# Patient Record
Sex: Female | Born: 1995 | Hispanic: No | Marital: Single | Smoking: Never smoker
Health system: Southern US, Community
[De-identification: ages and names within clinical notes are randomized; demographics above are authoritative.]

## PROBLEM LIST (undated history)

## (undated) DIAGNOSIS — N83201 Unspecified ovarian cyst, right side: Secondary | ICD-10-CM

## (undated) DIAGNOSIS — R519 Headache, unspecified: Secondary | ICD-10-CM

## (undated) DIAGNOSIS — N83202 Unspecified ovarian cyst, left side: Secondary | ICD-10-CM

## (undated) DIAGNOSIS — F509 Eating disorder, unspecified: Secondary | ICD-10-CM

## (undated) DIAGNOSIS — E079 Disorder of thyroid, unspecified: Secondary | ICD-10-CM

## (undated) DIAGNOSIS — F32A Depression, unspecified: Secondary | ICD-10-CM

## (undated) DIAGNOSIS — Z87440 Personal history of urinary (tract) infections: Secondary | ICD-10-CM

## (undated) HISTORY — DX: Disorder of thyroid, unspecified: E07.9

## (undated) HISTORY — DX: Eating disorder, unspecified: F50.9

## (undated) HISTORY — DX: Depression, unspecified: F32.A

## (undated) HISTORY — DX: Personal history of urinary (tract) infections: Z87.440

## (undated) HISTORY — DX: Headache, unspecified: R51.9

---
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
## 2020-08-07 ENCOUNTER — Encounter: Payer: Self-pay | Admitting: Internal Medicine

## 2020-08-07 ENCOUNTER — Other Ambulatory Visit: Payer: Self-pay

## 2020-08-07 ENCOUNTER — Ambulatory Visit: Payer: 59 | Admitting: Internal Medicine

## 2020-08-07 VITALS — BP 128/76 | HR 91 | Temp 98.7°F | Resp 18 | Ht 65.5 in | Wt 278.4 lb

## 2020-08-07 DIAGNOSIS — N926 Irregular menstruation, unspecified: Secondary | ICD-10-CM | POA: Diagnosis not present

## 2020-08-07 DIAGNOSIS — Z0000 Encounter for general adult medical examination without abnormal findings: Secondary | ICD-10-CM

## 2020-08-07 DIAGNOSIS — F4323 Adjustment disorder with mixed anxiety and depressed mood: Secondary | ICD-10-CM | POA: Diagnosis not present

## 2020-08-07 DIAGNOSIS — R7989 Other specified abnormal findings of blood chemistry: Secondary | ICD-10-CM | POA: Diagnosis not present

## 2020-08-07 LAB — LIPID PANEL
Cholesterol: 157 mg/dL (ref 0–200)
HDL: 36.5 mg/dL — ABNORMAL LOW (ref >39.00)
LDL Cholesterol: 81 mg/dL (ref 0–99)
NonHDL: 120.97
Total CHOL/HDL Ratio: 4
Triglycerides: 200 mg/dL — ABNORMAL HIGH (ref 0.0–149.0)
VLDL: 40 mg/dL (ref 0.0–40.0)

## 2020-08-07 LAB — CBC
HCT: 36.8 % (ref 36.0–46.0)
Hemoglobin: 11.8 g/dL — ABNORMAL LOW (ref 12.0–15.0)
MCHC: 32 g/dL (ref 30.0–36.0)
MCV: 80.1 fl (ref 78.0–100.0)
Platelets: 322 10*3/uL (ref 150.0–400.0)
RBC: 4.59 Mil/uL (ref 3.87–5.11)
RDW: 15.7 % — ABNORMAL HIGH (ref 11.5–15.5)
WBC: 10.4 10*3/uL (ref 4.0–10.5)

## 2020-08-07 LAB — T4, FREE: Free T4: 0.64 ng/dL (ref 0.60–1.60)

## 2020-08-07 LAB — COMPREHENSIVE METABOLIC PANEL
ALT: 14 U/L (ref 0–35)
AST: 16 U/L (ref 0–37)
Albumin: 4.3 g/dL (ref 3.5–5.2)
Alkaline Phosphatase: 86 U/L (ref 39–117)
BUN: 11 mg/dL (ref 6–23)
CO2: 26 mEq/L (ref 19–32)
Calcium: 9.4 mg/dL (ref 8.4–10.5)
Chloride: 106 mEq/L (ref 96–112)
Creatinine, Ser: 0.7 mg/dL (ref 0.40–1.20)
GFR: 122.46 mL/min (ref >60.00)
Glucose, Bld: 94 mg/dL (ref 70–99)
Potassium: 3.5 mEq/L (ref 3.5–5.1)
Sodium: 139 mEq/L (ref 135–145)
Total Bilirubin: 0.4 mg/dL (ref 0.2–1.2)
Total Protein: 7.5 g/dL (ref 6.0–8.3)

## 2020-08-07 LAB — TSH: TSH: 2.8 u[IU]/mL (ref 0.35–4.50)

## 2020-08-07 LAB — HEMOGLOBIN A1C: Hgb A1c MFr Bld: 5.8 % (ref 4.6–6.5)

## 2020-08-07 LAB — VITAMIN D 25 HYDROXY (VIT D DEFICIENCY, FRACTURES): VITD: 13.74 ng/mL — ABNORMAL LOW (ref 30.00–100.00)

## 2020-08-07 LAB — VITAMIN B12: Vitamin B-12: 483 pg/mL (ref 211–911)

## 2020-08-07 NOTE — Progress Notes (Signed)
   Subjective:   Patient ID: Alejandra Richardson, female    DOB: [birth year], 25 y.o.   MRN: [id number]  HPI The patient is a new 25 YO female coming in for concerns that she may have endometriosis (strong family history, she does have some abnormal periods and they have not been regular much, denies chance of pregnancy, needs help finding an ob/gyn) and weight (increasing over time and she has tried to lose weight, would like to talk to a counselor, concerned about complications from her weight and wants to get checked) and prior thyroid problems (she is not on medicine for this, has not had follow up for some time, denies cold or heat intolerance, has weight gain).  PMH, [facility], social history reviewed and updated  Review of Systems  Constitutional: Positive for unexpected weight change. Negative for activity change and appetite change.  HENT: Negative.   Eyes: Negative.   Respiratory: Negative for cough, chest tightness and shortness of breath.   Cardiovascular: Negative for chest pain, palpitations and leg swelling.  Gastrointestinal: Negative for abdominal distention, abdominal pain, constipation, diarrhea, nausea and vomiting.  Genitourinary: Positive for menstrual problem.  Musculoskeletal: Negative.   Skin: Negative.   Neurological: Negative.   Psychiatric/Behavioral: Negative.     Objective:  Physical Exam Constitutional:      Appearance: She is well-developed and well-nourished. She is obese.  HENT:     Head: Normocephalic and atraumatic.  Eyes:     Extraocular Movements: EOM normal.  Cardiovascular:     Rate and Rhythm: Normal rate and regular rhythm.  Pulmonary:     Effort: Pulmonary effort is normal. No respiratory distress.     Breath sounds: Normal breath sounds. No wheezing or rales.  Abdominal:     General: Bowel sounds are normal. There is no distension.     Palpations: Abdomen is soft.     Tenderness: There is no abdominal tenderness. There is no rebound.   Musculoskeletal:        General: No edema.     Cervical back: Normal range of motion.  Skin:    General: Skin is warm and dry.  Neurological:     Mental Status: She is alert and oriented to person, place, and time.     Coordination: Coordination normal.  Psychiatric:        Mood and Affect: Mood and affect normal.     Vitals:   08/07/20 1504  BP: 128/76  Pulse: 91  Resp: 18  Temp: 98.7 F (37.1 C)  TempSrc: Oral  SpO2: 98%  Weight: 278 lb 6.4 oz (126.3 kg)  Height: 5' 5.5" (1.664 m)   This visit occurred during the SARS-CoV-2 public health emergency.  Safety protocols were in place, including screening questions prior to the visit, additional usage of staff PPE, and extensive cleaning of exam room while observing appropriate contact time as indicated for disinfecting solutions.   Assessment & Plan:

## 2020-08-07 NOTE — Patient Instructions (Addendum)
You can try counseling or any number of things to help with the depression and anxiety.   We are checking the labs and thyroid today to check everything.  We will get you into an ob/gyn to check for the endometriosis.   Vaccines.gov to find the booster near you.   Stress, Adult Stress is a normal reaction to life events. Stress is what you feel when life demands more than you are used to, or more than you think you can handle. Some stress can be useful, such as studying for a test or meeting a deadline at work. Stress that occurs too often or for too long can cause problems. It can affect your emotional health and interfere with relationships and normal daily activities. Too much stress can weaken your body's defense system (immune system) and increase your risk for physical illness. If you already have a medical problem, stress can make it worse. What are the causes? All sorts of life events can cause stress. An event that causes stress for one person may not be stressful for another person. Major life events, whether positive or negative, commonly cause stress. Examples include:  Losing a job or starting a new job.  Losing a loved one.  Moving to a new town or home.  Getting married or divorced.  Having a baby.  Getting injured or sick. Less obvious life events can also cause stress, especially if they occur day after day or in combination with each other. Examples include:  Working long hours.  Driving in traffic.  Caring for children.  Being in debt.  Being in a difficult relationship. What are the signs or symptoms? Stress can cause emotional symptoms, including:  Anxiety. This is feeling worried, afraid, on edge, overwhelmed, or out of control.  Anger, including irritation or impatience.  Depression. This is feeling sad, down, helpless, or guilty.  Trouble focusing, remembering, or making decisions. Stress can cause physical symptoms, including:  Aches and pains.  These may affect your head, neck, back, stomach, or other areas of your body.  Tight muscles or a clenched jaw.  Low energy.  Trouble sleeping. Stress can cause unhealthy behaviors, including:  Eating to feel better (overeating) or skipping meals.  Working too much or putting off tasks.  Smoking, drinking alcohol, or using drugs to feel better. How is this diagnosed? Stress is diagnosed through an assessment by your health care provider. He or she may diagnose this condition based on:  Your symptoms and any stressful life events.  Your medical history.  Tests to rule out other causes of your symptoms. Depending on your condition, your health care provider may refer you to a specialist for further evaluation. How is this treated? Stress management techniques are the recommended treatment for stress. Medicine is not typically recommended for the treatment of stress. Techniques to reduce your reaction to stressful life events include:  Stress identification. Monitor yourself for symptoms of stress and identify what causes stress for you. These skills may help you to avoid or prepare for stressful events.  Time management. Set your priorities, keep a calendar of events, and learn to say no. Taking these actions can help you avoid making too many commitments. Techniques for coping with stress include:  Rethinking the problem. Try to think realistically about stressful events rather than ignoring them or overreacting. Try to find the positives in a stressful situation rather than focusing on the negatives.  Exercise. Physical exercise can release both physical and emotional tension. The key  is to find a form of exercise that you enjoy and do it regularly.  Relaxation techniques. These relax the body and mind. The key is to find one or more that you enjoy and use the techniques regularly. Examples include: ? Meditation, deep breathing, or progressive relaxation techniques. ? Yoga or  tai chi. ? Biofeedback, mindfulness techniques, or journaling. ? Listening to music, being out in nature, or participating in other hobbies.  Practicing a healthy lifestyle. Eat a balanced diet, drink plenty of water, limit or avoid caffeine, and get plenty of sleep.  Having a strong support network. Spend time with family, friends, or other people you enjoy being around. Express your feelings and talk things over with someone you trust. Counseling or talk therapy with a mental health professional may be helpful if you are having trouble managing stress on your own.   Follow these instructions at home: Lifestyle  Avoid drugs.  Do not use any products that contain nicotine or tobacco, such as cigarettes, e-cigarettes, and chewing tobacco. If you need help quitting, ask your health care provider.  Limit alcohol intake to no more than 1 drink a day for nonpregnant women and 2 drinks a day for men. One drink equals 12 oz of beer, 5 oz of wine, or 1 oz of hard liquor  Do not use alcohol or drugs to relax.  Eat a balanced diet that includes fresh fruits and vegetables, whole grains, lean meats, fish, eggs, and beans, and low-fat dairy. Avoid processed foods and foods high in added fat, sugar, and salt.  Exercise at least 30 minutes on 5 or more days each week.  Get 7-8 hours of sleep each night.   General instructions  Practice stress management techniques as discussed with your health care provider.  Drink enough fluid to keep your urine clear or pale yellow.  Take over-the-counter and prescription medicines only as told by your health care provider.  Keep all follow-up visits as told by your health care provider. This is important.   Contact a health care provider if:  Your symptoms get worse.  You have new symptoms.  You feel overwhelmed by your problems and can no longer manage them on your own. Get help right away if:  You have thoughts of hurting yourself or others. If you  ever feel like you may hurt yourself or others, or have thoughts about taking your own life, get help right away. You can go to your nearest emergency department or call:  Your local emergency services (911 in the [place]  A suicide crisis helpline, such as the [facility] at (765) 370-4746. This is open 24 hours a day. Summary  Stress is a normal reaction to life events. It can cause problems if it happens too often or for too long.  Practicing stress management techniques is the best way to treat stress.  Counseling or talk therapy with a mental health professional may be helpful if you are having trouble managing stress on your own. This information is not intended to replace advice given to you by your health care provider. Make sure you discuss any questions you have with your health care provider. Document Revised: 03/16/2020 Document Reviewed: 03/16/2020 Elsevier Patient Education  2021 [employer].

## 2020-08-10 ENCOUNTER — Telehealth: Payer: Self-pay | Admitting: Internal Medicine

## 2020-08-10 ENCOUNTER — Encounter: Payer: Self-pay | Admitting: Internal Medicine

## 2020-08-10 DIAGNOSIS — N926 Irregular menstruation, unspecified: Secondary | ICD-10-CM | POA: Insufficient documentation

## 2020-08-10 DIAGNOSIS — F4323 Adjustment disorder with mixed anxiety and depressed mood: Secondary | ICD-10-CM | POA: Insufficient documentation

## 2020-08-10 DIAGNOSIS — R7989 Other specified abnormal findings of blood chemistry: Secondary | ICD-10-CM | POA: Insufficient documentation

## 2020-08-10 NOTE — Telephone Encounter (Signed)
I don't see where these labs have been resulted.

## 2020-08-10 NOTE — Assessment & Plan Note (Signed)
Having trouble adjusting to living away from her family and going to school. Referral for counseling done today and we talked about coping strategies.

## 2020-08-10 NOTE — Telephone Encounter (Signed)
    Patient calling to discuss lab results and treatment

## 2020-08-10 NOTE — Assessment & Plan Note (Signed)
Checking TSH and free T4. She is having weight gain which could be related. Adjust as needed.

## 2020-08-10 NOTE — Assessment & Plan Note (Signed)
Referral to ob/gyn and she does have family history of endometriosis. She is willing to get evaluated. Checking total estrogen today.

## 2020-08-10 NOTE — Telephone Encounter (Signed)
Resulted

## 2020-08-10 NOTE — Assessment & Plan Note (Signed)
Checking for complications with lipid panel and HgA1c.

## 2020-08-12 LAB — ESTROGENS, TOTAL: Estrogen: 415.5 pg/mL

## 2020-09-06 ENCOUNTER — Ambulatory Visit (INDEPENDENT_AMBULATORY_CARE_PROVIDER_SITE_OTHER): Payer: 59 | Admitting: Psychology

## 2020-09-06 ENCOUNTER — Ambulatory Visit: Payer: 59 | Admitting: Internal Medicine

## 2020-09-06 ENCOUNTER — Other Ambulatory Visit: Payer: Self-pay

## 2020-09-06 ENCOUNTER — Encounter: Payer: Self-pay | Admitting: Internal Medicine

## 2020-09-06 VITALS — BP 122/76 | HR 64 | Temp 98.8°F | Resp 18 | Ht 65.5 in | Wt 273.0 lb

## 2020-09-06 DIAGNOSIS — F422 Mixed obsessional thoughts and acts: Secondary | ICD-10-CM | POA: Diagnosis not present

## 2020-09-06 DIAGNOSIS — F411 Generalized anxiety disorder: Secondary | ICD-10-CM

## 2020-09-06 DIAGNOSIS — R7989 Other specified abnormal findings of blood chemistry: Secondary | ICD-10-CM

## 2020-09-06 DIAGNOSIS — F331 Major depressive disorder, recurrent, moderate: Secondary | ICD-10-CM | POA: Diagnosis not present

## 2020-09-06 NOTE — Patient Instructions (Signed)
We will check an ultrasound of the thyroid.

## 2020-09-06 NOTE — Assessment & Plan Note (Signed)
Recent labs normal, ordered US thyroid today to assess neck fullness.

## 2020-09-06 NOTE — Progress Notes (Signed)
   Subjective:   Patient ID: Alejandra Richardson, female    DOB: 24-Oct-1995, 25 y.o.   MRN: [id number]  HPI The patient is a 25 YO female coming in for concerns about food catching and thyroid. She has had abnormal thyroid tests and although recent normal she is still thinking about this. Food sometimes catches or comes back up after getting stuck. Denies nausea. Weight stable and problems losing weight.   Review of Systems  Constitutional: Negative.   HENT: Positive for trouble swallowing.   Eyes: Negative.   Respiratory: Negative for cough, chest tightness and shortness of breath.   Cardiovascular: Negative for chest pain, palpitations and leg swelling.  Gastrointestinal: Negative for abdominal distention, abdominal pain, constipation, diarrhea, nausea and vomiting.  Musculoskeletal: Negative.   Skin: Negative.   Neurological: Negative.   Psychiatric/Behavioral: Negative.     Objective:  Physical Exam Constitutional:      Appearance: She is well-developed and well-nourished. She is obese.  HENT:     Head: Normocephalic and atraumatic.  Eyes:     Extraocular Movements: EOM normal.  Neck:     Comments: Thyroid fullness Cardiovascular:     Rate and Rhythm: Normal rate and regular rhythm.  Pulmonary:     Effort: Pulmonary effort is normal. No respiratory distress.     Breath sounds: Normal breath sounds. No wheezing or rales.  Abdominal:     General: Bowel sounds are normal. There is no distension.     Palpations: Abdomen is soft.     Tenderness: There is no abdominal tenderness. There is no rebound.  Musculoskeletal:        General: No edema.     Cervical back: Normal range of motion.  Skin:    General: Skin is warm and dry.  Neurological:     Mental Status: She is alert and oriented to person, place, and time.     Coordination: Coordination normal.  Psychiatric:        Mood and Affect: Mood and affect normal.     Vitals:   09/06/20 1515  BP: 122/76  Pulse: 64   Resp: 18  Temp: 98.8 F (37.1 C)  TempSrc: Oral  SpO2: 98%  Weight: 273 lb (123.8 kg)  Height: 5' 5.5" (1.664 m)    This visit occurred during the SARS-CoV-2 public health emergency.  Safety protocols were in place, including screening questions prior to the visit, additional usage of staff PPE, and extensive cleaning of exam room while observing appropriate contact time as indicated for disinfecting solutions.   Assessment & Plan:

## 2020-09-19 ENCOUNTER — Ambulatory Visit
Admission: RE | Admit: 2020-09-19 | Discharge: 2020-09-19 | Disposition: A | Discharge status: Home / Self Care | Payer: 59 | Source: Ambulatory Visit | Attending: Internal Medicine | Admitting: Internal Medicine

## 2020-09-19 DIAGNOSIS — R7989 Other specified abnormal findings of blood chemistry: Secondary | ICD-10-CM

## 2020-09-24 ENCOUNTER — Ambulatory Visit (INDEPENDENT_AMBULATORY_CARE_PROVIDER_SITE_OTHER): Payer: 59 | Admitting: Psychology

## 2020-09-24 DIAGNOSIS — F422 Mixed obsessional thoughts and acts: Secondary | ICD-10-CM

## 2020-09-24 DIAGNOSIS — F332 Major depressive disorder, recurrent severe without psychotic features: Secondary | ICD-10-CM

## 2020-09-24 DIAGNOSIS — F411 Generalized anxiety disorder: Secondary | ICD-10-CM

## 2020-10-05 ENCOUNTER — Ambulatory Visit (HOSPITAL_COMMUNITY)
Admission: EM | Admit: 2020-10-05 | Discharge: 2020-10-05 | Disposition: A | Discharge status: Home / Self Care | Payer: 59 | Attending: Medical Oncology | Admitting: Medical Oncology

## 2020-10-05 ENCOUNTER — Encounter (HOSPITAL_COMMUNITY): Payer: Self-pay | Admitting: Emergency Medicine

## 2020-10-05 ENCOUNTER — Other Ambulatory Visit: Payer: Self-pay

## 2020-10-05 ENCOUNTER — Ambulatory Visit (INDEPENDENT_AMBULATORY_CARE_PROVIDER_SITE_OTHER): Payer: 59

## 2020-10-05 DIAGNOSIS — M5442 Lumbago with sciatica, left side: Secondary | ICD-10-CM | POA: Diagnosis not present

## 2020-10-05 DIAGNOSIS — M5441 Lumbago with sciatica, right side: Secondary | ICD-10-CM | POA: Diagnosis not present

## 2020-10-05 DIAGNOSIS — M545 Low back pain, unspecified: Secondary | ICD-10-CM

## 2020-10-05 MED ORDER — ACETAMINOPHEN 500 MG PO TABS
1000.0000 mg | ORAL_TABLET | Freq: Once | ORAL | Status: AC
  Administered 2020-10-05: 975 mg via ORAL

## 2020-10-05 MED ORDER — ACETAMINOPHEN 325 MG PO TABS
ORAL_TABLET | ORAL | Status: AC
  Filled 2020-10-05: qty 3

## 2020-10-05 MED ORDER — PREDNISONE 10 MG (21) PO TBPK: ORAL_TABLET | Freq: Every day | ORAL | 0 refills | Status: DC

## 2020-10-05 MED ORDER — CYCLOBENZAPRINE HCL 10 MG PO TABS: 10.0000 mg | ORAL_TABLET | Freq: Two times a day (BID) | ORAL | 0 refills | Status: DC | PRN

## 2020-10-05 NOTE — ED Provider Notes (Signed)
[facility]    CSN: [id number] Arrival date & time: 10/05/20  1232      History   Chief Complaint Chief Complaint  Patient presents with  . Back Pain    Lower back    HPI Alejandra Richardson is a 25 y.o. female.   HPI  Back Pain: Pt states that for the past 2 days she has had lower back pain. Lower back pain radiates down both legs. Pt reports that she suspects that symptoms started after doing heavy lifting at work. He states that she felt a twinge in her back while performing the work but this quickly resolved. 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 When she started to get out of the chair from her break she noticed significant bilateral back pain. This has is rated 10/10 in nature both sharp with movements and achy at rest. She is having trouble sitting, walking and sleeping due to the pain. No numbness of groin, incontinence or neuro changes. She has tried tylenol at home along with ice and heating pad sessions which has not really improved symptoms. She has not taken anything today.    Past Medical History:  Diagnosis Date  . Depression   . Eating disorder   . Frequent headaches   . History of frequent urinary tract infections   . Thyroid disease     Patient Active Problem List   Diagnosis Date Noted  . Adjustment disorder with mixed anxiety and depressed mood 08/10/2020  . Morbid obesity (HCC) 08/10/2020  . Menstrual problem 08/10/2020  . Abnormal thyroid blood test 08/10/2020    Past Surgical History:  Procedure Laterality Date  . TONSILLECTOMY AND ADENOIDECTOMY  2009    OB History   No obstetric history on file.      Home Medications    Prior to Admission medications   Not on File    Family History Family History  Problem Relation Age of Onset  . Depression Mother   . Hearing loss Mother   . Mental illness Mother   . Depression Sister   . Mental illness  Sister   . Depression Brother   . Mental illness Brother   . Learning disabilities Brother   . Intellectual disability Brother   . Stroke Maternal Grandmother   . Depression Sister   . Mental illness Sister     Social History Social History   Tobacco Use  . Smoking status: Never Smoker  . Smokeless tobacco: Never Used  Substance Use Topics  . Alcohol use: Not Currently  . Drug use: Never     Allergies   Patient has no known allergies.   Review of Systems Review of Systems  As stated above in HPI Physical Exam Triage Vital Signs ED Triage Vitals  Enc Vitals Group     BP 10/05/20 1307 108/69     Pulse Rate 10/05/20 1307 74     Resp 10/05/20 1307 18     Temp 10/05/20 1307 98.2 F (36.8 C)     Temp Source 10/05/20 1307 Oral     SpO2 10/05/20 1307 98 %     Weight 10/05/20 1304 273 lb (123.8 kg)     Height 10/05/20 1304 5\' 3"  (1.6 m)     Head Circumference --      Peak Flow --      Pain Score 10/05/20 1303 10  Pain Loc --      Pain Edu? --      Excl. in GC? --    No data found.  Updated Vital Signs BP 108/69 (BP Location: Right Arm)   Pulse 74   Temp 98.2 F (36.8 C) (Oral)   Resp 18   Ht 5\' 3"  (1.6 m)   Wt 273 lb (123.8 kg)   LMP 09/06/2020   SpO2 98%   BMI 48.36 kg/m   Physical Exam Vitals and nursing note reviewed.  Constitutional:      General: She is in acute distress.     Appearance: Normal appearance. She is not ill-appearing, toxic-appearing or diaphoretic.  Musculoskeletal:        General: Tenderness present. No swelling or deformity.     Cervical back: Normal.     Thoracic back: Spasms and tenderness present. No bony tenderness. Decreased range of motion.     Lumbar back: Spasms and tenderness present. No bony tenderness. Decreased range of motion. Positive right straight leg raise test and positive left straight leg raise test. No scoliosis.       Back:     Right lower leg: No edema.     Left lower leg: No edema.  Neurological:      Mental Status: She is alert and oriented to person, place, and time.     Coordination: Coordination normal.     Gait: Gait abnormal (slow due to pain).     Deep Tendon Reflexes: Reflexes normal.  Psychiatric:     Comments: Pt is tearful during examination      UC Treatments / Results  Labs (all labs ordered are listed, but only abnormal results are displayed) Labs Reviewed - No data to display  EKG   Radiology No results found.  Procedures Procedures (including critical care time)  Medications Ordered in UC Medications - No data to display  Initial Impression / Assessment and Plan / UC Course  I have reviewed the triage vital signs and the nursing notes.  Pertinent labs & imaging results that were available during my care of the patient were reviewed by me and considered in my medical decision making (see chart for details).     New. X rays reassuring. Given extent of pain and positive SLR she will need x rays and to be started on muscle relaxers and prednisone along with tylenol or NSAIDs. Discussed how to use along with red flag signs and symptoms. Heating pad sessions and gentle sessions.    Final Clinical Impressions(s) / UC Diagnoses   Final diagnoses:  None   Discharge Instructions   None    ED Prescriptions    None     PDMP not reviewed this encounter.   09/08/2020, Rushie Chestnut 10/05/20 1430

## 2020-10-05 NOTE — ED Triage Notes (Signed)
Pt presents today with lower back pain x 2 days. Pain radiates down both legs. She does report heavy lifting at work.

## 2020-10-08 ENCOUNTER — Ambulatory Visit (INDEPENDENT_AMBULATORY_CARE_PROVIDER_SITE_OTHER): Payer: 59 | Admitting: Psychology

## 2020-10-08 DIAGNOSIS — F332 Major depressive disorder, recurrent severe without psychotic features: Secondary | ICD-10-CM | POA: Diagnosis not present

## 2020-10-08 DIAGNOSIS — F411 Generalized anxiety disorder: Secondary | ICD-10-CM

## 2020-10-08 DIAGNOSIS — F422 Mixed obsessional thoughts and acts: Secondary | ICD-10-CM | POA: Diagnosis not present

## 2020-10-22 ENCOUNTER — Ambulatory Visit (INDEPENDENT_AMBULATORY_CARE_PROVIDER_SITE_OTHER): Payer: 59 | Admitting: Psychology

## 2020-10-22 DIAGNOSIS — F332 Major depressive disorder, recurrent severe without psychotic features: Secondary | ICD-10-CM

## 2020-10-22 DIAGNOSIS — F411 Generalized anxiety disorder: Secondary | ICD-10-CM

## 2020-10-22 DIAGNOSIS — F422 Mixed obsessional thoughts and acts: Secondary | ICD-10-CM

## 2020-11-05 ENCOUNTER — Ambulatory Visit (INDEPENDENT_AMBULATORY_CARE_PROVIDER_SITE_OTHER): Payer: 59 | Admitting: Psychology

## 2020-11-05 DIAGNOSIS — F422 Mixed obsessional thoughts and acts: Secondary | ICD-10-CM | POA: Diagnosis not present

## 2020-11-05 DIAGNOSIS — F411 Generalized anxiety disorder: Secondary | ICD-10-CM

## 2020-11-05 DIAGNOSIS — F332 Major depressive disorder, recurrent severe without psychotic features: Secondary | ICD-10-CM

## 2020-11-19 ENCOUNTER — Ambulatory Visit (INDEPENDENT_AMBULATORY_CARE_PROVIDER_SITE_OTHER): Payer: 59 | Admitting: Psychology

## 2020-11-19 DIAGNOSIS — F331 Major depressive disorder, recurrent, moderate: Secondary | ICD-10-CM | POA: Diagnosis not present

## 2020-11-19 DIAGNOSIS — F411 Generalized anxiety disorder: Secondary | ICD-10-CM

## 2020-11-19 DIAGNOSIS — F422 Mixed obsessional thoughts and acts: Secondary | ICD-10-CM

## 2020-11-23 ENCOUNTER — Ambulatory Visit (HOSPITAL_COMMUNITY)
Admission: EM | Admit: 2020-11-23 | Discharge: 2020-11-23 | Disposition: A | Discharge status: Home / Self Care | Payer: 59

## 2020-11-23 ENCOUNTER — Other Ambulatory Visit: Payer: Self-pay

## 2020-11-23 ENCOUNTER — Ambulatory Visit (INDEPENDENT_AMBULATORY_CARE_PROVIDER_SITE_OTHER): Payer: 59

## 2020-11-23 ENCOUNTER — Encounter (HOSPITAL_COMMUNITY): Payer: Self-pay

## 2020-11-23 DIAGNOSIS — S63612A Unspecified sprain of right middle finger, initial encounter: Secondary | ICD-10-CM

## 2020-11-23 DIAGNOSIS — S62632A Displaced fracture of distal phalanx of right middle finger, initial encounter for closed fracture: Secondary | ICD-10-CM | POA: Diagnosis not present

## 2020-11-23 HISTORY — DX: Unspecified ovarian cyst, right side: N83.202

## 2020-11-23 HISTORY — DX: Unspecified ovarian cyst, right side: N83.201

## 2020-11-23 MED ORDER — HYDROCODONE-ACETAMINOPHEN 5-325 MG PO TABS: 2.0000 | ORAL_TABLET | Freq: Four times a day (QID) | ORAL | 0 refills | Status: AC | PRN

## 2020-11-23 NOTE — Discharge Instructions (Signed)
Please call orthopedics today to schedule a visit for early next week for follow up

## 2020-11-23 NOTE — ED Triage Notes (Signed)
Pt reports she feels she dislocated her right middle finger while removing her skinny jeans last night. Pt reports pain shooting up arm from finger and that she is not able to move the finger. States finger still has feeling.

## 2020-11-23 NOTE — ED Provider Notes (Addendum)
[facility]    CSN: [id number] Arrival date & time: 11/23/20  0807      History   Chief Complaint Chief Complaint  Patient presents with  . Possible Finger Dislocation    HPI Alejandra Richardson is a 25 y.o. female.   HPI   Finger Pain: Patient reports that when she was removing her skinny jeans last night she felt as if her right middle finger dislocated itself. NO major injury at the time and no previous injury. She states that she still continues to have pain of this area and that she is not able to move the finger.  She has sensation of the finger as well of 8 out of 10 pain. She has tried ibuprofen and ice with some improvement.    Past Medical History:  Diagnosis Date  . Bilateral ovarian cysts    pt reports having ovarian cysts but not sure if on both sides  . Depression   . Eating disorder   . Frequent headaches   . History of frequent urinary tract infections   . Thyroid disease     Patient Active Problem List   Diagnosis Date Noted  . Adjustment disorder with mixed anxiety and depressed mood 08/10/2020  . Morbid obesity (HCC) 08/10/2020  . Menstrual problem 08/10/2020  . Abnormal thyroid blood test 08/10/2020    Past Surgical History:  Procedure Laterality Date  . TONSILLECTOMY AND ADENOIDECTOMY  2009    OB History   No obstetric history on file.      Home Medications    Prior to Admission medications   Medication Sig Start Date End Date Taking? Authorizing Provider  Norethin Ace-Eth Estrad-FE (HAILEY 24 FE PO) Take by mouth.   Yes [provider]  cyclobenzaprine (FLEXERIL) 10 MG tablet Take 1 tablet (10 mg total) by mouth 2 (two) times daily as needed for muscle spasms. 10/05/20   Rushie Chestnut, PA-C  predniSONE (STERAPRED UNI-PAK 21 TAB) 10 MG (21) TBPK tablet Take by mouth daily. Take 6 tabs by mouth daily  for 2 days, then 5 tabs for 2 days, then 4 tabs for 2 days, then 3 tabs for 2 days, 2 tabs for 2 days, then 1 tab  by mouth daily for 2 days 10/05/20   Rushie Chestnut, PA-C    Family History Family History  Problem Relation Age of Onset  . Depression Mother   . Hearing loss Mother   . Mental illness Mother   . Depression Sister   . Mental illness Sister   . Depression Brother   . Mental illness Brother   . Learning disabilities Brother   . Intellectual disability Brother   . Stroke Maternal Grandmother   . Depression Sister   . Mental illness Sister     Social History Social History   Tobacco Use  . Smoking status: Never Smoker  . Smokeless tobacco: Never Used  Substance Use Topics  . Alcohol use: Not Currently  . Drug use: Never     Allergies   Patient has no known allergies.   Review of Systems Review of Systems  As stated above in HPI Physical Exam Triage Vital Signs ED Triage Vitals  Enc Vitals Group     BP 11/23/20 0842 117/64     Pulse Rate 11/23/20 0842 61     Resp 11/23/20 0842 18     Temp 11/23/20 0842 98.7 F (37.1 C)     Temp src --  SpO2 11/23/20 0842 100 %     Weight --      Height --      Head Circumference --      Peak Flow --      Pain Score 11/23/20 0834 8     Pain Loc --      Pain Edu? --      Excl. in GC? --    No data found.  Updated Vital Signs BP 117/64   Pulse 61   Temp 98.7 F (37.1 C)   Resp 18   LMP 11/12/2020 Comment: pt unsure of exact date  SpO2 100%    Physical Exam Vitals and nursing note reviewed.  Cardiovascular:     Pulses: Normal pulses.  Musculoskeletal:        General: Tenderness (Right middle DIP and PIP joint) present. No swelling.     Comments: Limited ROM of the right middle finger  Skin:    Capillary Refill: Capillary refill takes less than 2 seconds.     Comments: No skin breakdown  Neurological:     Sensory: No sensory deficit.      UC Treatments / Results  Labs (all labs ordered are listed, but only abnormal results are displayed) Labs Reviewed - No data to  display  EKG   Radiology No results found.  Procedures Procedures (including critical care time)  Medications Ordered in UC Medications - No data to display  Initial Impression / Assessment and Plan / UC Course  I have reviewed the triage vital signs and the nursing notes.  Pertinent labs & imaging results that were available during my care of the patient were reviewed by me and considered in my medical decision making (see chart for details).     New. Discussed with orthopedics. Will place in a finger splint, treat for pain, discuss red flag signs and symptoms and have her call orthopedics today to schedule follow up given the nature of her injury as she likely will need further work up and management to ensure no further bone abnormality. Discussed with patient including medication safety and black box warnings.    Final Clinical Impressions(s) / UC Diagnoses   Final diagnoses:  None   Discharge Instructions   None    ED Prescriptions    None     PDMP not reviewed this encounter.   Rushie Chestnut, PA-C 11/23/20 0951    Rushie Chestnut, PA-C 11/23/20 [phone]

## 2020-12-18 ENCOUNTER — Ambulatory Visit (INDEPENDENT_AMBULATORY_CARE_PROVIDER_SITE_OTHER): Payer: 59 | Admitting: Psychology

## 2020-12-18 DIAGNOSIS — F330 Major depressive disorder, recurrent, mild: Secondary | ICD-10-CM

## 2020-12-18 DIAGNOSIS — F411 Generalized anxiety disorder: Secondary | ICD-10-CM

## 2020-12-18 DIAGNOSIS — F422 Mixed obsessional thoughts and acts: Secondary | ICD-10-CM | POA: Diagnosis not present

## 2021-01-08 ENCOUNTER — Ambulatory Visit: Payer: 59 | Admitting: Psychology

## 2021-02-06 ENCOUNTER — Ambulatory Visit: Payer: 59 | Admitting: Internal Medicine

## 2021-02-20 ENCOUNTER — Ambulatory Visit: Payer: 59 | Admitting: Internal Medicine

## 2021-04-01 NOTE — Progress Notes (Signed)
Subjective:    Patient ID: Alejandra Richardson, female    DOB: 1995-10-21, 25 y.o.   MRN: [id number]  This visit occurred during the SARS-CoV-2 public health emergency.  Safety protocols were in place, including screening questions prior to the visit, additional usage of staff PPE, and extensive cleaning of exam room while observing appropriate contact time as indicated for disinfecting solutions.    HPI The patient is here for an acute visit.   Pulled muscle in back -she did something similar earlier this year.  She pulled or strained the muscle 2 days ago - the day before she picked up something heavier and may have turned the wrong way - not sure.  She denies any other injuries.  The pain is in the lower-mid back on both sides.  It feels like it is pulling and pinching.  Pain kind of radiates to lower mid back.  No n/t.  The pain is worse with any movement.  Has taken ibuprofen- helped a little.  She would prefer not to take any prescription medications if possible.  She does have an appointment with a chiropractor in a couple of days and was unsure if she should go to that.  She thinks she needs a few days off work to get this straight would like to treated conservatively.   Medications and allergies reviewed with patient and updated if appropriate.  Patient Active Problem List   Diagnosis Date Noted   Muscle spasm of back 04/02/2021   Adjustment disorder with mixed anxiety and depressed mood 08/10/2020   Morbid obesity (HCC) 08/10/2020   Menstrual problem 08/10/2020   Abnormal thyroid blood test 08/10/2020    No current outpatient medications on file prior to visit.   No current facility-administered medications on file prior to visit.    Past Medical History:  Diagnosis Date   Bilateral ovarian cysts    pt reports having ovarian cysts but not sure if on both sides   Depression    Eating disorder    Frequent headaches    History of frequent urinary tract infections     Thyroid disease     Past Surgical History:  Procedure Laterality Date   TONSILLECTOMY AND ADENOIDECTOMY  2009    Social History   Socioeconomic History   Marital status: Single    Spouse name: Not on file   Number of children: Not on file   Years of education: Not on file   Highest education level: Not on file  Occupational History   Not on file  Tobacco Use   Smoking status: Never   Smokeless tobacco: Never  Substance and Sexual Activity   Alcohol use: Not Currently   Drug use: Never   Sexual activity: Not Currently  Other Topics Concern   Not on file  Social History Narrative   Not on file   Social Determinants of Health   Financial Resource Strain: Not on file  Food Insecurity: Not on file  Transportation Needs: Not on file  Physical Activity: Not on file  Stress: Not on file  Social Connections: Not on file    Family History  Problem Relation Age of Onset   Depression Mother    Hearing loss Mother    Mental illness Mother    Depression Sister    Mental illness Sister    Depression Brother    Mental illness Brother    Learning disabilities Brother    Intellectual disability Brother    Stroke Maternal Grandmother  Depression Sister    Mental illness Sister     Review of Systems     Objective:   Vitals:   04/02/21 0946  BP: 118/80  Pulse: 83  Temp: 98.4 F (36.9 C)  SpO2: 99%   BP Readings from Last 3 Encounters:  04/02/21 118/80  11/23/20 117/64  10/05/20 108/69   Wt Readings from Last 3 Encounters:  04/02/21 271 lb (122.9 kg)  10/05/20 273 lb (123.8 kg)  09/06/20 273 lb (123.8 kg)   Body mass index is 48.01 kg/m.   Physical Exam Constitutional:      General: She is not in acute distress.    Appearance: Normal appearance. She is not ill-appearing.  HENT:     Head: Normocephalic and atraumatic.  Musculoskeletal:        General: Tenderness (Bilateral paravertebral muscles proximal lumbar spine) present. No swelling or  deformity.     Right lower leg: No edema.     Left lower leg: No edema.  Skin:    General: Skin is warm and dry.  Neurological:     Mental Status: She is alert.     Sensory: No sensory deficit.     Motor: No weakness.           Assessment & Plan:    See Problem List for Assessment and Plan of chronic medical problems.

## 2021-04-02 ENCOUNTER — Other Ambulatory Visit: Payer: Self-pay

## 2021-04-02 ENCOUNTER — Ambulatory Visit (INDEPENDENT_AMBULATORY_CARE_PROVIDER_SITE_OTHER): Payer: 59 | Admitting: Internal Medicine

## 2021-04-02 ENCOUNTER — Encounter: Payer: Self-pay | Admitting: Internal Medicine

## 2021-04-02 DIAGNOSIS — M62830 Muscle spasm of back: Secondary | ICD-10-CM | POA: Insufficient documentation

## 2021-04-02 NOTE — Patient Instructions (Signed)
  Continue ice and heat.  You can try topical arthritis/muscle medications.   Try gentle stretching.    See the chiropractor.    Note given for work.

## 2021-04-02 NOTE — Assessment & Plan Note (Signed)
Acute Strain muscles of the lower-mid back bilaterally Deferred any prescription medication Continue ibuprofen as needed, but do not take long-term Continue ice, heat, topical arthritis/muscle medications, try stretching gently Will see chiropractor in a couple of days Note given for work

## 2021-08-28 ENCOUNTER — Ambulatory Visit: Payer: 59 | Admitting: Internal Medicine

## 2021-11-24 ENCOUNTER — Encounter (HOSPITAL_COMMUNITY): Payer: Self-pay | Admitting: Emergency Medicine

## 2021-11-24 ENCOUNTER — Ambulatory Visit (HOSPITAL_COMMUNITY)
Admission: EM | Admit: 2021-11-24 | Discharge: 2021-11-24 | Disposition: A | Discharge status: Home / Self Care | Payer: 59 | Attending: Internal Medicine | Admitting: Internal Medicine

## 2021-11-24 DIAGNOSIS — F411 Generalized anxiety disorder: Secondary | ICD-10-CM

## 2021-11-24 DIAGNOSIS — R1033 Periumbilical pain: Secondary | ICD-10-CM | POA: Diagnosis not present

## 2021-11-24 DIAGNOSIS — J301 Allergic rhinitis due to pollen: Secondary | ICD-10-CM

## 2021-11-24 MED ORDER — CETIRIZINE HCL 10 MG PO TABS: 10.0000 mg | ORAL_TABLET | Freq: Every day | ORAL | 2 refills | Status: AC

## 2021-11-24 NOTE — ED Provider Notes (Signed)
[facility]    CSN: [id number] Arrival date & time: 11/24/21  1027      History   Chief Complaint Chief Complaint  Patient presents with   Abdominal Pain    HPI Alejandra Richardson is a 26 y.o. female.   Patient presents to urgent care for evaluation of her abdominal pain that started Friday night (11/22/21). She reports a lot of anxiety and does not take any medication for this. 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 Her abdominal pain Friday felt like a "knot" in the middle of her stomach and went away Saturday (yesterday) morning. She started her menstrual cycle yesterday morning and takes birth control for menstrual symptoms. She reports mild menstrual cramping yesterday and today that is well controlled with heating pad at home. Last dose of ibuprofen was yesterday at 3-4pm 800 mg. At work yesterday, she had to handle a situation with an angry customer and her abdominal pain returned as the same knot in her stomach and went away once she "calmed her nerves down". Reports normal bowel movements. No diarrhea, vomiting, or nausea. Denies dysuria, urinary frequency, and urinary urgency. Denies vaginal discharge/odor prior to starting menstrual period. She has never taken any medication for her anxiety symptoms in the past and plans to make an appointment with her primary care provider to get on medication to help with her symptoms. Denies any other aggravating or relieving factors. No recent antibiotic use. Denies abdominal surgeries in the past, but states that she does have PCOS.   Abdominal Pain  Past Medical History:  Diagnosis Date   Bilateral ovarian cysts    pt reports having ovarian cysts but not sure if on both sides   Depression    Eating disorder    Frequent headaches    History of frequent urinary tract infections    Thyroid disease     Patient Active Problem List   Diagnosis Date Noted   Muscle spasm of back 04/02/2021   Adjustment disorder with  mixed anxiety and depressed mood 08/10/2020   Morbid obesity (HCC) 08/10/2020   Menstrual problem 08/10/2020   Abnormal thyroid blood test 08/10/2020    Past Surgical History:  Procedure Laterality Date   TONSILLECTOMY AND ADENOIDECTOMY  2009    OB History   No obstetric history on file.      Home Medications    Prior to Admission medications   Medication Sig Start Date End Date Taking? Authorizing Provider  cetirizine (ZYRTEC) 10 MG tablet Take 1 tablet (10 mg total) by mouth daily. 11/24/21  Yes Temisha Murley, Donavan Burnet, FNP    Family History Family History  Problem Relation Age of Onset   Depression Mother    Hearing loss Mother    Mental illness Mother    Depression Sister    Mental illness Sister    Depression Brother    Mental illness Brother    Learning disabilities Brother    Intellectual disability Brother    Stroke Maternal Grandmother    Depression Sister    Mental illness Sister     Social History Social History   Tobacco Use   Smoking status: Never   Smokeless tobacco: Never  Substance Use Topics   Alcohol use: Not Currently   Drug use: Never     Allergies   Patient has no known allergies.   Review of Systems Review of Systems  Gastrointestinal:  Positive for abdominal pain.  Per HPI  Physical Exam Triage Vital  Signs ED Triage Vitals  Enc Vitals Group     BP 11/24/21 1129 127/83     Pulse Rate 11/24/21 1129 62     Resp 11/24/21 1129 17     Temp 11/24/21 1129 98.7 F (37.1 C)     Temp Source 11/24/21 1129 Oral     SpO2 11/24/21 1129 98 %     Weight 11/24/21 1128 270 lb 15.1 oz (122.9 kg)     Height 11/24/21 1128 5\' 3"  (1.6 m)     Head Circumference --      Peak Flow --      Pain Score 11/24/21 1128 4     Pain Loc --      Pain Edu? --      Excl. in GC? --    No data found.  Updated Vital Signs BP 127/83 (BP Location: Right Arm)   Pulse 62   Temp 98.7 F (37.1 C) (Oral)   Resp 17   Ht 5\' 3"  (1.6 m)   Wt 270 lb 15.1 oz  (122.9 kg)   LMP 11/23/2021 (Exact Date)   SpO2 98%   BMI 48.00 kg/m   Visual Acuity Right Eye Distance:   Left Eye Distance:   Bilateral Distance:    Right Eye Near:   Left Eye Near:    Bilateral Near:     Physical Exam Vitals and nursing note reviewed.  Constitutional:      General: She is not in acute distress.    Appearance: Normal appearance. She is well-developed. She is not ill-appearing.  HENT:     Head: Normocephalic and atraumatic.     Right Ear: Tympanic membrane, ear canal and external ear normal.     Left Ear: Tympanic membrane, ear canal and external ear normal.     Nose: Rhinorrhea present.     Mouth/Throat:     Mouth: Mucous membranes are moist.     Comments: Mild erythema to posterior oropharynx with small amount of clear postnasal drainage visualized. Airway intact and patent. Eyes:     Extraocular Movements: Extraocular movements intact.     Conjunctiva/sclera: Conjunctivae normal.  Cardiovascular:     Rate and Rhythm: Normal rate and regular rhythm.     Heart sounds: Normal heart sounds. No murmur heard.   No friction rub. No gallop.  Pulmonary:     Effort: Pulmonary effort is normal. No respiratory distress.     Breath sounds: Normal breath sounds. No wheezing, rhonchi or rales.  Chest:     Chest wall: No tenderness.  Abdominal:     General: Abdomen is protuberant. Bowel sounds are normal. There is no distension or abdominal bruit. There are no signs of injury.     Palpations: Abdomen is soft.     Tenderness: There is abdominal tenderness in the right lower quadrant, periumbilical area, suprapubic area and left lower quadrant. There is no right CVA tenderness or left CVA tenderness.     Comments: No peritoneal signs elicited to abdominal exam.  Right and left lower quadrant abdominal pain as well as suprapubic abdominal pain is likely related to patient's menstrual cramps.  Musculoskeletal:        General: No swelling.     Cervical back: Neck  supple.  Skin:    General: Skin is warm and dry.     Capillary Refill: Capillary refill takes less than 2 seconds.     Findings: No rash.  Neurological:     General: No focal deficit  present.     Mental Status: She is alert and oriented to person, place, and time. Mental status is at baseline.     Motor: No weakness.     Gait: Gait normal.  Psychiatric:        Mood and Affect: Mood normal.        Behavior: Behavior normal.        Thought Content: Thought content normal.        Judgment: Judgment normal.     UC Treatments / Results  Labs (all labs ordered are listed, but only abnormal results are displayed) Labs Reviewed - No data to display  EKG   Radiology No results found.  Procedures Procedures (including critical care time)  Medications Ordered in UC Medications - No data to display  Initial Impression / Assessment and Plan / UC Course  I have reviewed the triage vital signs and the nursing notes.  Pertinent labs & imaging results that were available during my care of the patient were reviewed by me and considered in my medical decision making (see chart for details).  Patient is a 26 year old female presenting to urgent care today for abdominal pain that is intermittent and exacerbated by feelings of anxiety.  She is mildly tender to palpation of her periumbilical area of her abdomen as well as her right and left lower quadrants.  She is currently on her menstrual cycle and reports significant menstrual cramping.  No clinical indication for further evaluation in the emergency room or advanced imaging today due to stable physical exam and vital signs at this time.  Abdominal pain is likely a physical manifestation of patient's generalized anxiety disorder.  Encourage patient to call her primary care provider tomorrow to schedule an appointment to discuss anxiety further.   Discussed anxiety symptoms at length with patient.  She states that she has a positive family  history for anxiety to her maternal side of the family.  Family has noticed her increased anxiety and would like for her to be placed on a medication to help her with her symptoms.  Patient has not been ready to take a medication for her anxiety until recently.  Discussed grounding techniques to help patient manage her anxiety while at work as well as deep breathing exercises. Patient states she will seek help from her primary care provider and make an appointment to be seen when the office opens tomorrow.   Rhinorrhea and mild postnasal drainage and erythema to posterior oropharynx noted to physical exam.  Patient has never been diagnosed with allergic rhinitis in the past, but has been meaning to get some nondrowsy antihistamine to help with her symptoms.  Prescribed cetirizine 10 mg to be taken once daily today for allergic rhinitis.    She may continue to take ibuprofen and tylenol for her   Counseled patient regarding appropriate use of medications and potential side effects for all medications recommended or prescribed today. Discussed red flag signs and symptoms of worsening condition,when to call the PCP office, return to urgent care, and when to seek higher level of care. Patient verbalizes understanding and agreement with plan. All questions answered. Patient discharged in stable condition.    Final Clinical Impressions(s) / UC Diagnoses   Final diagnoses:  Generalized anxiety disorder  Periumbilical abdominal pain  Seasonal allergic rhinitis due to pollen     Discharge Instructions      Schedule an appointment with your primary care provider to discuss what we talked about urgent care today.  Use grounding techniques and deep breathing to decrease acute anxiety as discussed.  Continue to nourish your body by eating regularly and drinking plenty of water.  You are doing awesome.    If you develop any new or worsening symptoms or do not improve in the next 2 to 3 days, please return.   If your symptoms are severe, please go to the emergency room.  Follow-up with your primary care provider for further evaluation and management of your symptoms as well as ongoing wellness visits.  I hope you feel better!     ED Prescriptions     Medication Sig Dispense Auth. Provider   cetirizine (ZYRTEC) 10 MG tablet Take 1 tablet (10 mg total) by mouth daily. 30 tablet Carlisle Beers, FNP      PDMP not reviewed this encounter.   Carlisle Beers, FNP 11/24/21 1300

## 2021-11-24 NOTE — Discharge Instructions (Signed)
Schedule an appointment with your primary care provider to discuss what we talked about urgent care today.  Use grounding techniques and deep breathing to decrease acute anxiety as discussed.  Continue to nourish your body by eating regularly and drinking plenty of water.  You are doing awesome.   ? ?If you develop any new or worsening symptoms or do not improve in the next 2 to 3 days, please return.  If your symptoms are severe, please go to the emergency room.  Follow-up with your primary care provider for further evaluation and management of your symptoms as well as ongoing wellness visits.  I hope you feel better! ?

## 2021-11-24 NOTE — ED Triage Notes (Signed)
Pt reports abdominal pain. States on 5/12 she had a "knot" in the mid abdomen area, described as "someone turning something in her stomach". The next morning the pain had resolved. Then the same abdominal pain returned yesterday evening. States she was at work when the pain started after an anxiety attack.  ? ?

## 2021-11-26 ENCOUNTER — Ambulatory Visit: Payer: 59 | Admitting: Internal Medicine

## 2021-11-26 ENCOUNTER — Encounter: Payer: Self-pay | Admitting: Internal Medicine

## 2021-11-26 DIAGNOSIS — F4323 Adjustment disorder with mixed anxiety and depressed mood: Secondary | ICD-10-CM | POA: Diagnosis not present

## 2021-11-26 MED ORDER — ESCITALOPRAM OXALATE 5 MG PO TABS: 5.0000 mg | ORAL_TABLET | Freq: Every day | ORAL | 0 refills | Status: DC

## 2021-11-26 NOTE — Assessment & Plan Note (Signed)
Rx lexapro 5 mg daily and she will check in with [place] in 2 weeks and at 4 weeks and we will adjust dosing as needed for symptom control. There is strong family history of mental health problems and lexapro has helped other members of her family.  ?

## 2021-11-26 NOTE — Patient Instructions (Signed)
We have sent in the 5 mg lexapro. You can start with either 1/2 pill daily or 1 pill daily depending on your comfort level. After 2 weeks let us know how you are doing with a phone call or mychart message.  ? ?Around 1 month we will also plan to check in and see if the dose needs to be changed. ?

## 2021-11-26 NOTE — Progress Notes (Signed)
? ?  Subjective:  ? ?Patient ID: Alejandra Richardson, female    DOB: Sep 30, 1995, 26 y.o.   MRN: [id number] ? ?HPI ?The patient is a 26 YO coming in for worsening mental health and anxiety.  ? ?Review of Systems  ?Constitutional: Negative.   ?HENT: Negative.    ?Eyes: Negative.   ?Respiratory:  Negative for cough, chest tightness and shortness of breath.   ?Cardiovascular:  Negative for chest pain, palpitations and leg swelling.  ?Gastrointestinal:  Positive for abdominal pain. Negative for abdominal distention, constipation, diarrhea, nausea and vomiting.  ?Musculoskeletal: Negative.   ?Skin: Negative.   ?Neurological: Negative.   ?Psychiatric/Behavioral:  Positive for dysphoric mood. The patient is nervous/anxious.   ? ?Objective:  ?Physical Exam ?Constitutional:   ?   Appearance: She is well-developed. She is obese.  ?HENT:  ?   Head: Normocephalic and atraumatic.  ?Cardiovascular:  ?   Rate and Rhythm: Normal rate and regular rhythm.  ?Pulmonary:  ?   Effort: Pulmonary effort is normal. No respiratory distress.  ?   Breath sounds: Normal breath sounds. No wheezing or rales.  ?Abdominal:  ?   General: Bowel sounds are normal. There is no distension.  ?   Palpations: Abdomen is soft.  ?   Tenderness: There is no abdominal tenderness. There is no rebound.  ?Musculoskeletal:  ?   Cervical back: Normal range of motion.  ?Skin: ?   General: Skin is warm and dry.  ?Neurological:  ?   Mental Status: She is alert and oriented to person, place, and time.  ?   Coordination: Coordination normal.  ? ? ?Vitals:  ? 11/26/21 0839  ?BP: 120/80  ?Pulse: 74  ?Resp: 18  ?SpO2: 96%  ?Weight: 246 lb 3.2 oz (111.7 kg)  ?Height: 5\' 3"  (1.6 m)  ? ? ?Assessment & Plan:  ? ?

## 2021-12-18 ENCOUNTER — Other Ambulatory Visit: Payer: Self-pay | Admitting: Internal Medicine

## 2021-12-24 ENCOUNTER — Ambulatory Visit (INDEPENDENT_AMBULATORY_CARE_PROVIDER_SITE_OTHER): Payer: 59 | Admitting: Internal Medicine

## 2021-12-24 ENCOUNTER — Encounter: Payer: Self-pay | Admitting: Internal Medicine

## 2021-12-24 VITALS — BP 122/68 | HR 78 | Resp 18 | Ht 63.0 in | Wt 243.0 lb

## 2021-12-24 DIAGNOSIS — F4323 Adjustment disorder with mixed anxiety and depressed mood: Secondary | ICD-10-CM

## 2021-12-24 DIAGNOSIS — Z0000 Encounter for general adult medical examination without abnormal findings: Secondary | ICD-10-CM

## 2021-12-24 MED ORDER — HAILEY 24 FE 1-20 MG-MCG(24) PO TABS: 1.0000 | ORAL_TABLET | Freq: Every day | ORAL | 3 refills | Status: AC

## 2021-12-24 MED ORDER — ESCITALOPRAM OXALATE 10 MG PO TABS: 10.0000 mg | ORAL_TABLET | Freq: Every day | ORAL | 3 refills | Status: AC

## 2021-12-24 MED ORDER — ESCITALOPRAM OXALATE 10 MG PO TABS: 10.0000 mg | ORAL_TABLET | Freq: Every day | ORAL | 3 refills | Status: DC

## 2021-12-24 NOTE — Assessment & Plan Note (Signed)
Will increase lexapro to 10 mg daily as planned.

## 2021-12-24 NOTE — Assessment & Plan Note (Signed)
Counseled about diet and exercise. 

## 2021-12-24 NOTE — Progress Notes (Signed)
   Subjective:   Patient ID: Alejandra Richardson, female    DOB: Mar 31, 1996, 26 y.o.   MRN: [id number]  HPI The patient is a 26 YO female coming in for physical.   PMH, FMH, social history reviewed and updated  Review of Systems  Constitutional: Negative.   HENT: Negative.    Eyes: Negative.   Respiratory:  Negative for cough, chest tightness and shortness of breath.   Cardiovascular:  Negative for chest pain, palpitations and leg swelling.  Gastrointestinal:  Negative for abdominal distention, abdominal pain, constipation, diarrhea, nausea and vomiting.  Musculoskeletal: Negative.   Skin: Negative.   Neurological: Negative.   Psychiatric/Behavioral: Negative.      Objective:  Physical Exam Constitutional:      Appearance: She is well-developed. She is obese.  HENT:     Head: Normocephalic and atraumatic.  Cardiovascular:     Rate and Rhythm: Normal rate and regular rhythm.  Pulmonary:     Effort: Pulmonary effort is normal. No respiratory distress.     Breath sounds: Normal breath sounds. No wheezing or rales.  Abdominal:     General: Bowel sounds are normal. There is no distension.     Palpations: Abdomen is soft.     Tenderness: There is no abdominal tenderness. There is no rebound.  Musculoskeletal:     Cervical back: Normal range of motion.  Skin:    General: Skin is warm and dry.  Neurological:     Mental Status: She is alert and oriented to person, place, and time.     Coordination: Coordination normal.     Vitals:   12/24/21 1022  BP: 122/68  Pulse: 78  Resp: 18  SpO2: 98%  Weight: 243 lb (110.2 kg)  Height: 5\' 3"  (1.6 m)    Assessment & Plan:

## 2021-12-24 NOTE — Assessment & Plan Note (Signed)
Flu shot declines. Covid-19 counseled. Tetanus up to date. Pap smear up to date. Counseled about sun safety and mole surveillance. Counseled about the dangers of distracted driving. Given 10 year screening recommendations.   

## 2021-12-24 NOTE — Patient Instructions (Signed)
We have increased the lexapro to 10 mg daily.

## 2022-02-21 IMAGING — DX DG SI JOINTS 3+V
3 series · 3 of 3 positions shown · non-contrast
Comparison: None.

CLINICAL DATA: Back pain

EXAM:
BILATERAL SACROILIAC JOINTS - 3+ VIEW

[si joint (1 of 3)]
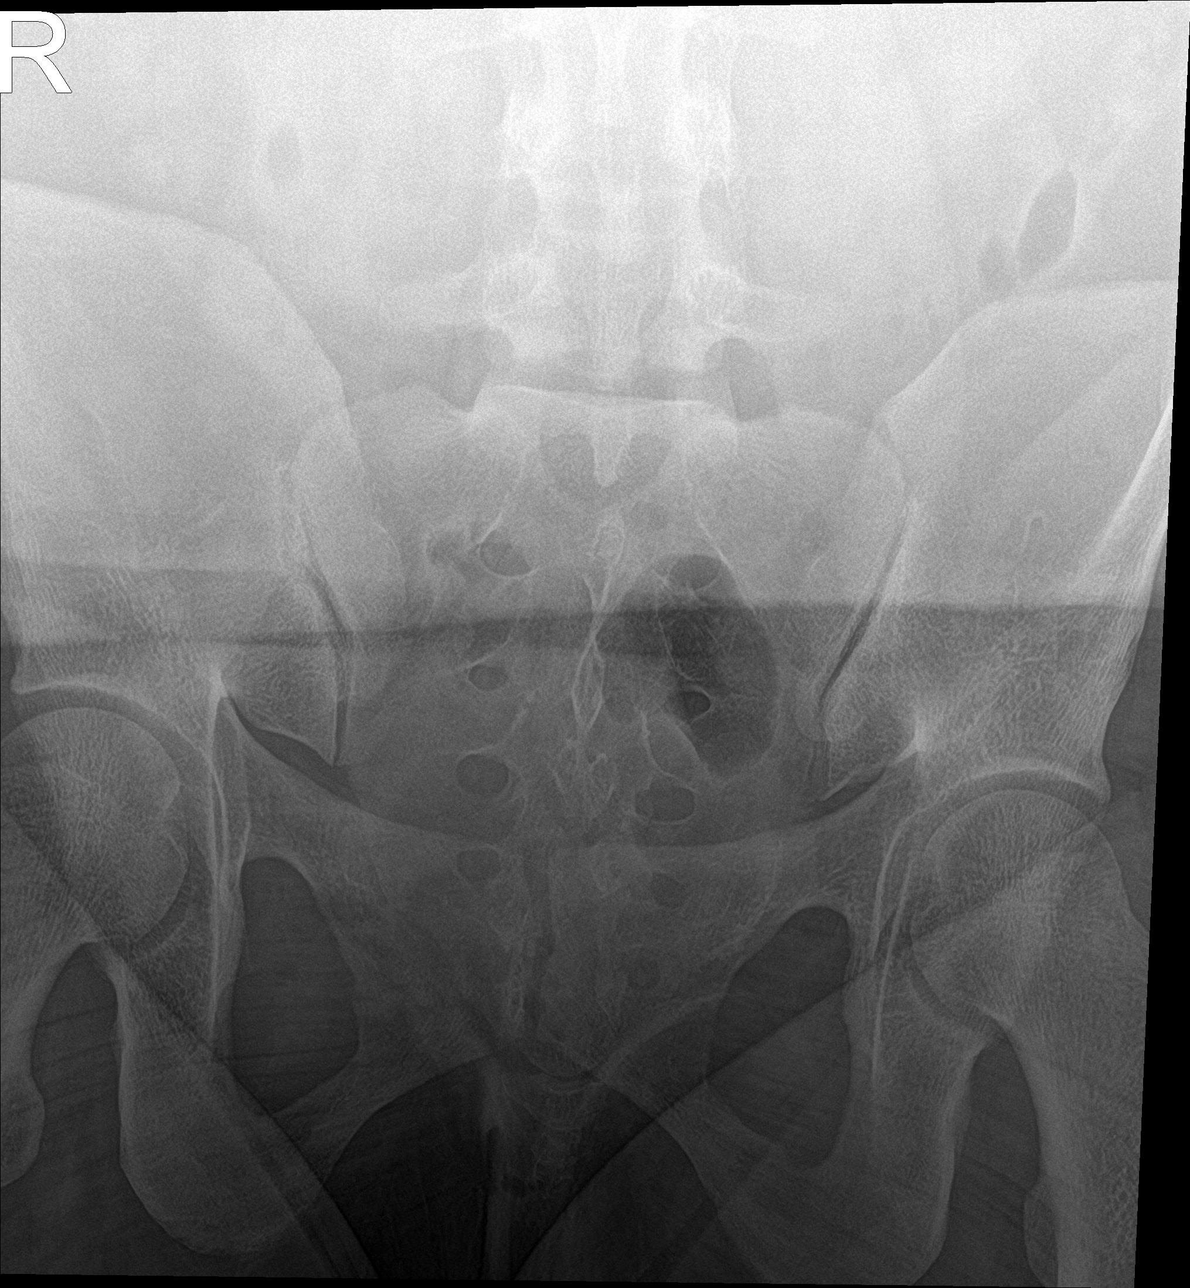

[si joint (2 of 3)]
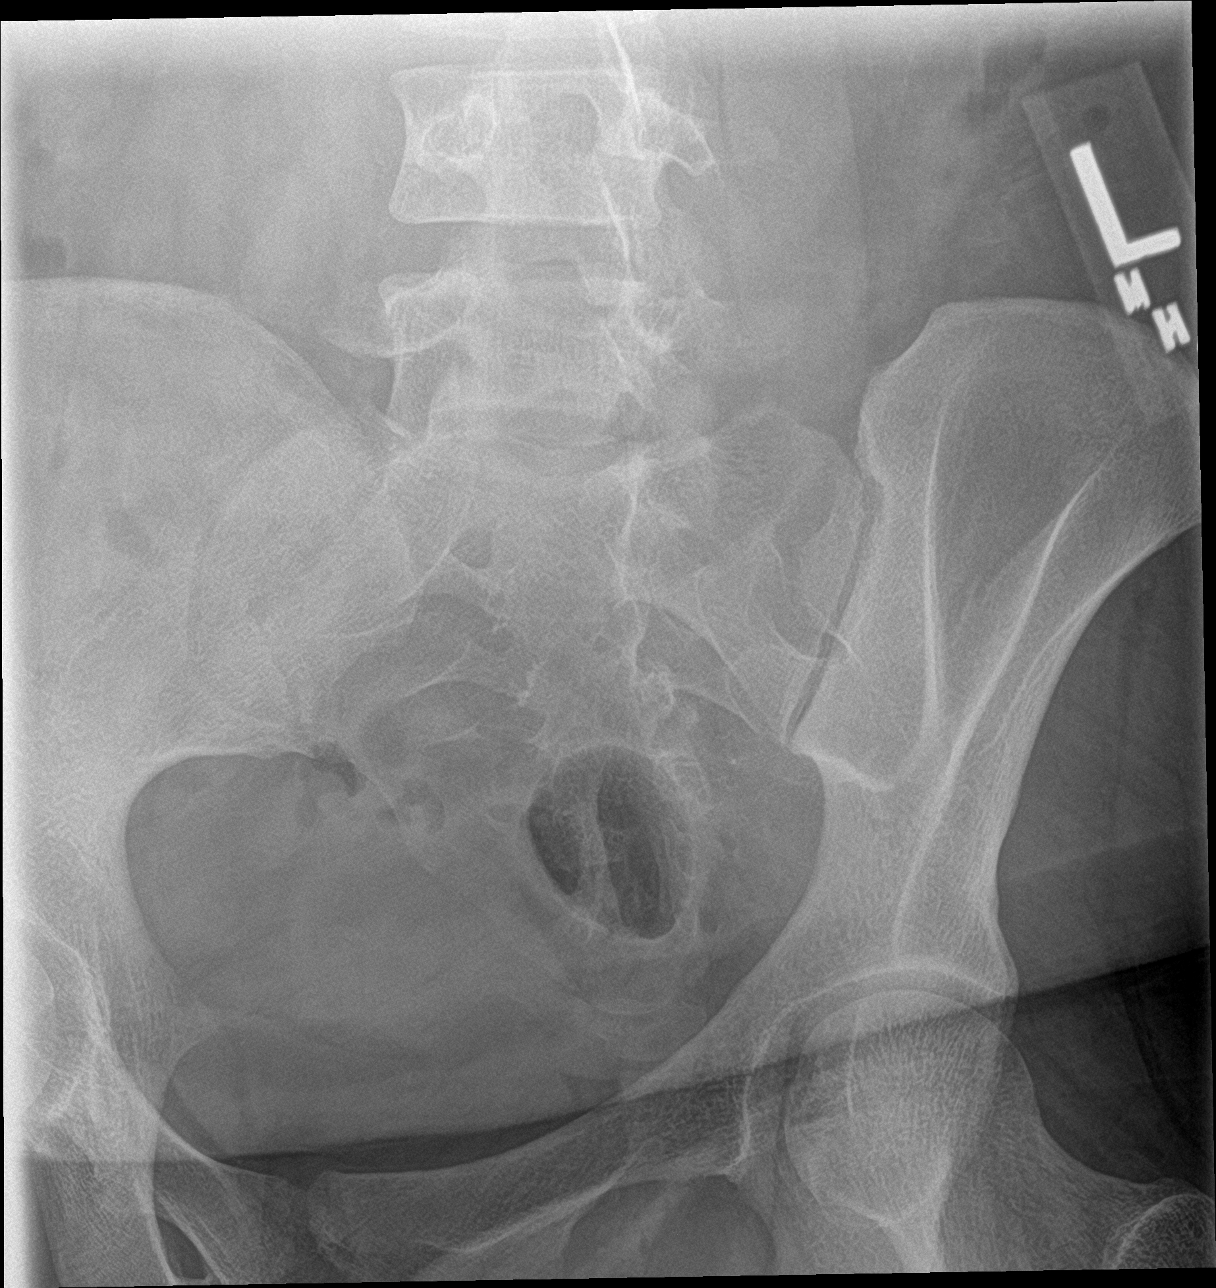

[si joint (3 of 3)]
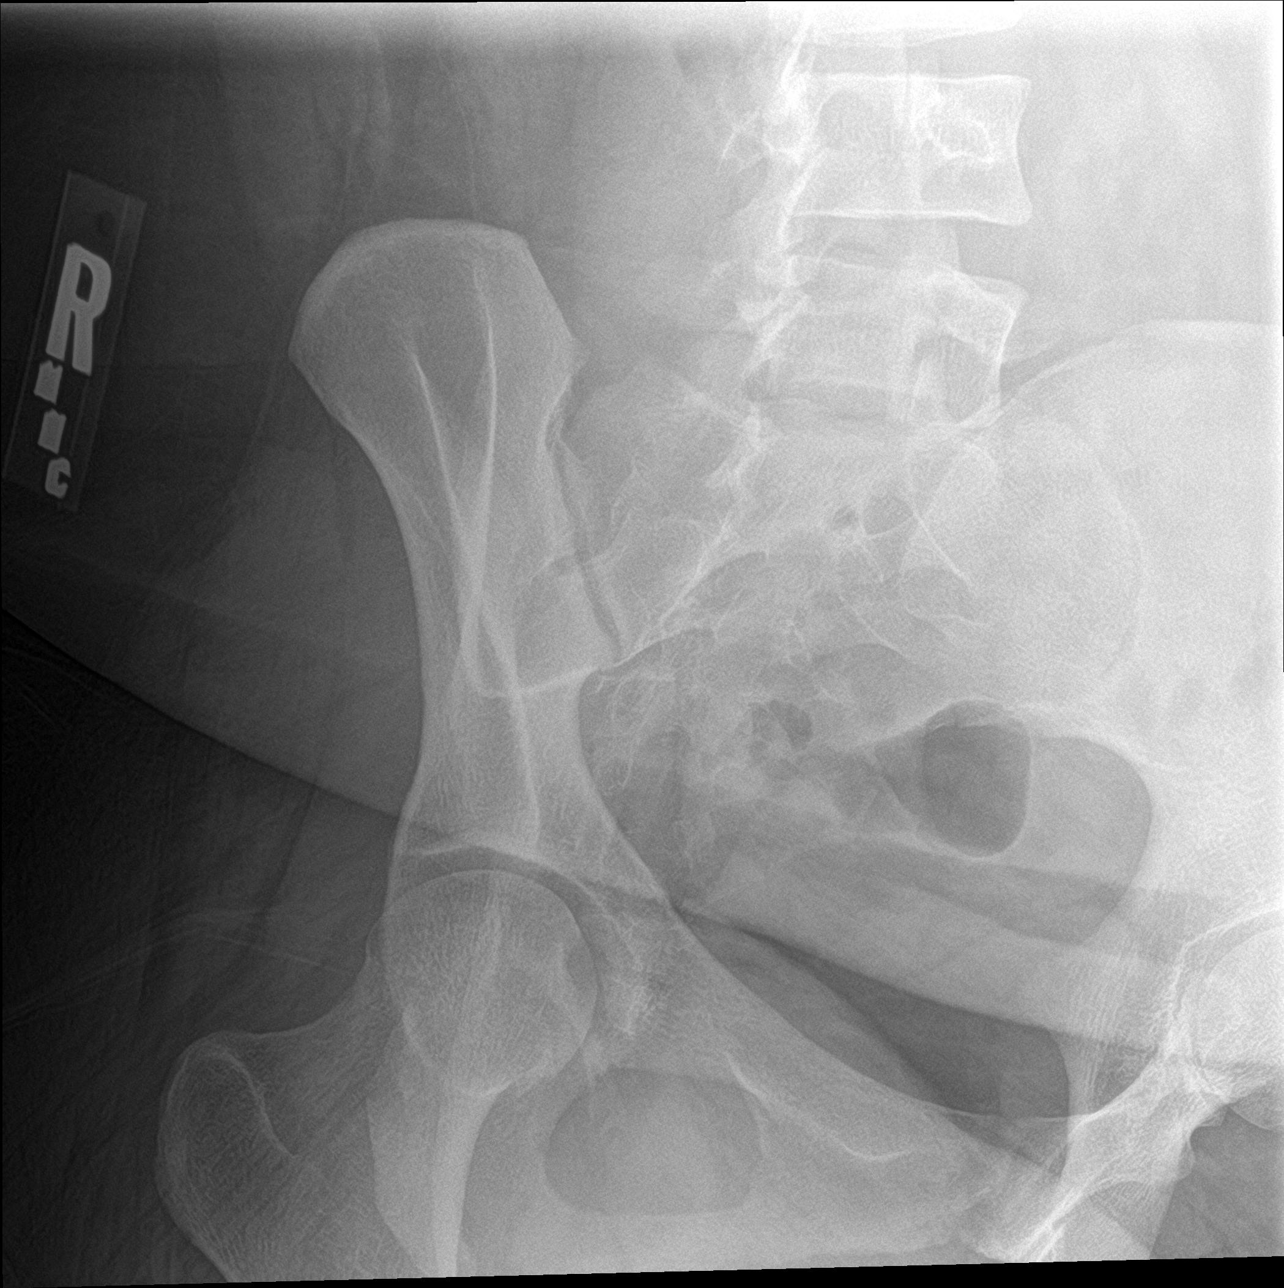

[3 of 3 positions shown; findings below may reference images not displayed]

FINDINGS: The sacroiliac joint spaces are maintained and there is no evidence
of arthropathy. No ankylosis. No sclerosis or erosive changes to
suggest sacroiliitis. No other bone abnormalities are seen.
IMPRESSION: Normal SI joints.

## 2022-04-11 IMAGING — DX DG FINGER MIDDLE 2+V*R*
3 series · 3 of 3 positions shown · non-contrast
Comparison: None.

CLINICAL DATA: Finger pain, concern for dislocation.

EXAM:
RIGHT MIDDLE FINGER 2+V

[finger ap]
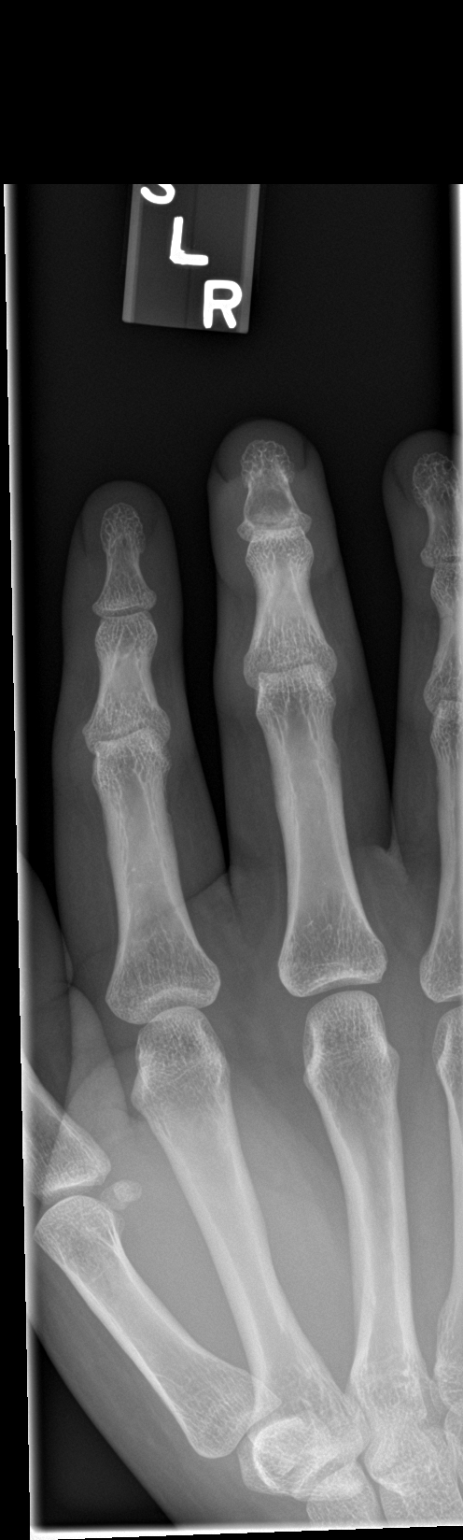

[finger obl]
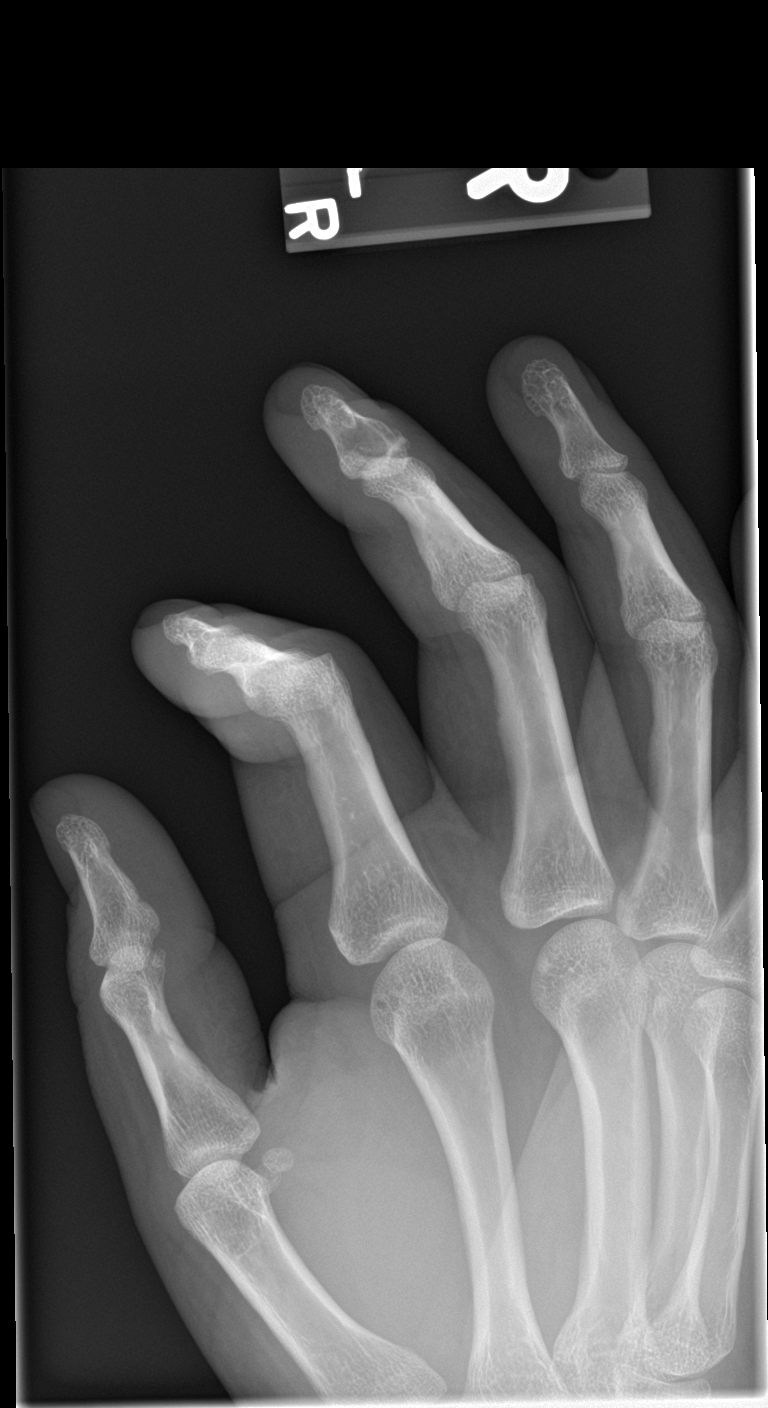

[finger lat]
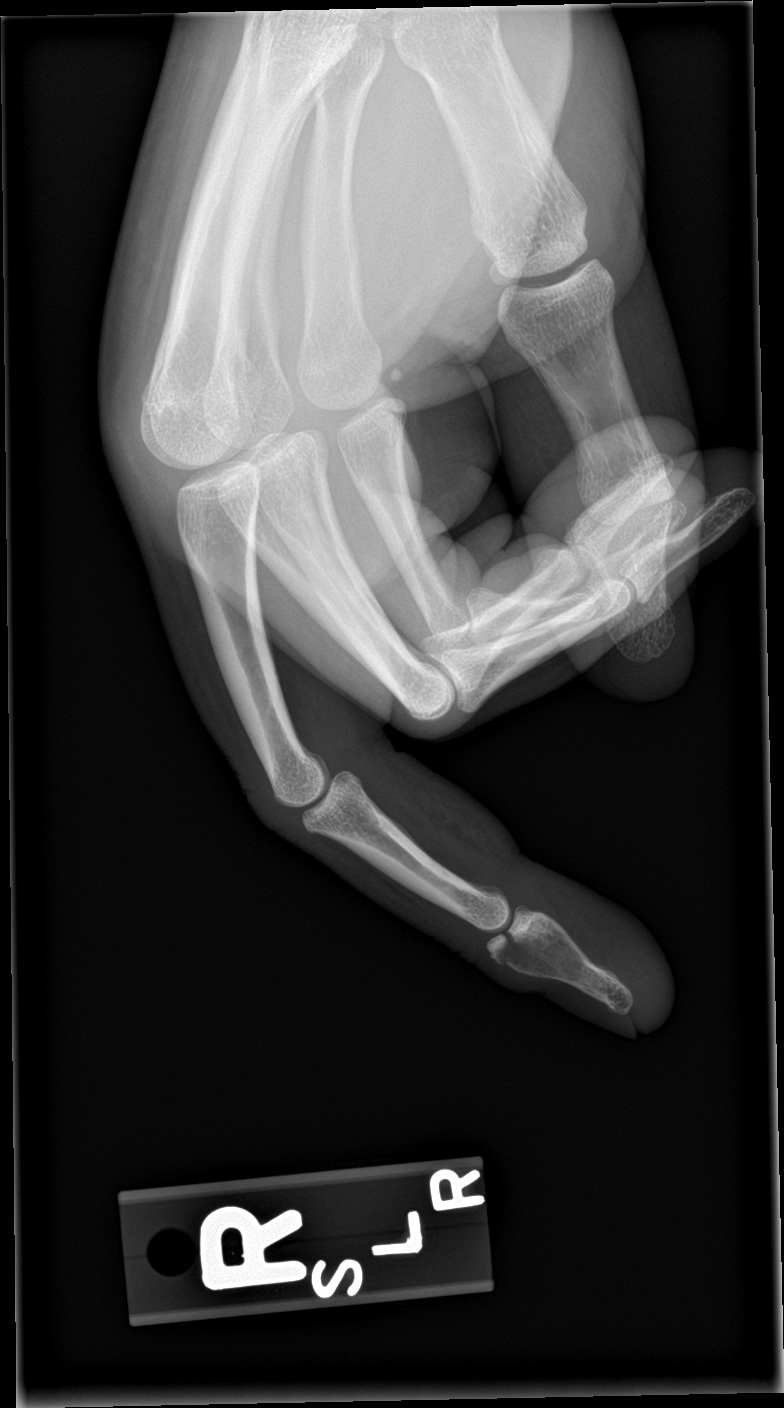

[3 of 3 positions shown; findings below may reference images not displayed]

FINDINGS: There is a mildly displaced fracture at the base of the distal
phalanx of the middle finger, with intra-articular involvement. No
evidence of dislocation. No additional acute osseous finding in the
visualized bones. There is regional soft tissue swelling.
IMPRESSION: Mildly displaced intra-articular fracture at the base of the distal
phalanx of the right middle finger.

## 2022-09-15 ENCOUNTER — Telehealth: Payer: Self-pay

## 2022-09-15 NOTE — Transitions of Care (Post Inpatient/ED Visit) (Unsigned)
   09/15/2022  Name: Alejandra Richardson MRN: [id number] DOB: December 22, 1995  Today's TOC FU Call Status: Today's TOC FU Call Status:: Unsuccessul Call (1st Attempt) Unsuccessful Call (1st Attempt) Date: 09/15/22  Attempted to reach the patient regarding the most recent Inpatient/ED visit.  Follow Up Plan: Additional outreach attempts will be made to reach the patient to complete the Transitions of Care (Post Inpatient/ED visit) call.   Signature Juanda Crumble, [place] Direct Dial [phone]

## 2022-09-16 NOTE — Transitions of Care (Post Inpatient/ED Visit) (Signed)
   09/16/2022  Name: Alejandra Richardson MRN: [id number] DOB: [birth year]  Today's TOC FU Call Status: Today's TOC FU Call Status:: Unsuccessful Call (2nd Attempt) Unsuccessful Call (1st Attempt) Date: 09/15/22 Unsuccessful Call (2nd Attempt) Date: 09/16/22  Attempted to reach the patient regarding the most recent Inpatient/ED visit.  Follow Up Plan: No further outreach attempts will be made at this time. We have been unable to contact the patient.  Signature Juanda Crumble, [place] Direct Dial [phone]
